# Patient Record
Sex: Male | Born: 1950 | Race: White | Hispanic: No | Marital: Married | Smoking: Former smoker
Health system: Southern US, Community
[De-identification: ages and names within clinical notes are randomized; demographics above are authoritative.]

## PROBLEM LIST (undated history)

## (undated) DIAGNOSIS — N400 Enlarged prostate without lower urinary tract symptoms: Secondary | ICD-10-CM

## (undated) DIAGNOSIS — E7800 Pure hypercholesterolemia, unspecified: Secondary | ICD-10-CM

## (undated) DIAGNOSIS — I10 Essential (primary) hypertension: Secondary | ICD-10-CM

## (undated) DIAGNOSIS — Z8679 Personal history of other diseases of the circulatory system: Secondary | ICD-10-CM

## (undated) HISTORY — PX: CARDIOVERSION: EP1203

---

## 1998-03-23 ENCOUNTER — Ambulatory Visit (HOSPITAL_COMMUNITY): Admission: RE | Admit: 1998-03-23 | Discharge: 1998-03-23 | Payer: Self-pay | Admitting: *Deleted

## 2001-12-31 ENCOUNTER — Encounter: Payer: Self-pay | Admitting: Family Medicine

## 2001-12-31 ENCOUNTER — Encounter: Admission: RE | Admit: 2001-12-31 | Discharge: 2001-12-31 | Payer: Self-pay | Admitting: Family Medicine

## 2003-06-08 ENCOUNTER — Ambulatory Visit (HOSPITAL_COMMUNITY): Admission: RE | Admit: 2003-06-08 | Discharge: 2003-06-08 | Payer: Self-pay | Admitting: Gastroenterology

## 2004-08-08 ENCOUNTER — Ambulatory Visit (HOSPITAL_COMMUNITY): Admission: RE | Admit: 2004-08-08 | Discharge: 2004-08-08 | Payer: Self-pay | Admitting: Family Medicine

## 2006-02-25 ENCOUNTER — Emergency Department (HOSPITAL_COMMUNITY): Admission: AC | Admit: 2006-02-25 | Discharge: 2006-02-25 | Payer: Self-pay | Admitting: Emergency Medicine

## 2006-04-14 ENCOUNTER — Emergency Department (HOSPITAL_COMMUNITY): Admission: EM | Admit: 2006-04-14 | Discharge: 2006-04-14 | Payer: Self-pay | Admitting: Family Medicine

## 2006-05-17 ENCOUNTER — Emergency Department (HOSPITAL_COMMUNITY): Admission: EM | Admit: 2006-05-17 | Discharge: 2006-05-17 | Payer: Self-pay | Admitting: Family Medicine

## 2006-06-05 ENCOUNTER — Emergency Department (HOSPITAL_COMMUNITY): Admission: EM | Admit: 2006-06-05 | Discharge: 2006-06-05 | Payer: Self-pay | Admitting: Family Medicine

## 2006-06-20 ENCOUNTER — Emergency Department (HOSPITAL_COMMUNITY): Admission: EM | Admit: 2006-06-20 | Discharge: 2006-06-20 | Payer: Self-pay | Admitting: Family Medicine

## 2007-01-20 ENCOUNTER — Emergency Department (HOSPITAL_COMMUNITY): Admission: EM | Admit: 2007-01-20 | Discharge: 2007-01-20 | Payer: Self-pay | Admitting: Family Medicine

## 2007-11-03 ENCOUNTER — Ambulatory Visit (HOSPITAL_COMMUNITY): Admission: RE | Admit: 2007-11-03 | Discharge: 2007-11-03 | Payer: Self-pay | Admitting: Family Medicine

## 2008-05-16 ENCOUNTER — Emergency Department (HOSPITAL_COMMUNITY): Admission: EM | Admit: 2008-05-16 | Discharge: 2008-05-16 | Payer: Self-pay | Admitting: Family Medicine

## 2008-12-30 ENCOUNTER — Ambulatory Visit: Payer: Self-pay | Admitting: Family Medicine

## 2008-12-30 DIAGNOSIS — J069 Acute upper respiratory infection, unspecified: Secondary | ICD-10-CM | POA: Insufficient documentation

## 2009-12-03 ENCOUNTER — Emergency Department (HOSPITAL_COMMUNITY): Admission: EM | Admit: 2009-12-03 | Discharge: 2009-12-03 | Payer: Self-pay | Admitting: Emergency Medicine

## 2010-03-23 ENCOUNTER — Encounter (INDEPENDENT_AMBULATORY_CARE_PROVIDER_SITE_OTHER): Payer: Self-pay | Admitting: *Deleted

## 2010-03-29 ENCOUNTER — Emergency Department (HOSPITAL_COMMUNITY): Admission: EM | Admit: 2010-03-29 | Discharge: 2010-03-29 | Payer: Self-pay | Admitting: Family Medicine

## 2010-10-30 NOTE — Letter (Signed)
Summary: Colonoscopy Date Change Letter  [facility]  [address] [place], [place] [address]   Phone: [phone]  Fax: [phone]      March 23, 2010 MRN: [id number]   Travis Barton [address] [place], [place]  [address]   Dear [name],   Previously you were recommended to have a repeat colonoscopy around this time. Your chart was recently reviewed by Dr. Judie Petit T. Russella Dar of [facility]. Follow up colonoscopy is now recommended in September 2014. This revised recommendation is based on current, nationally recognized guidelines for colorectal cancer screening and polyp surveillance. These guidelines are endorsed by the American Cancer Society, [employer] on Colorectal Cancer as well as numerous other major medical organizations.  Please understand that our recommendation assumes that you do not have any new symptoms such as bleeding, a change in bowel habits, anemia, or significant abdominal discomfort. If you do have any concerning GI symptoms or want to discuss the guideline recommendations, please call to arrange an office visit at your earliest convenience. Otherwise we will keep you in our reminder system and contact you 1-2 months prior to the date listed above to schedule your next colonoscopy.  Thank you,  Judie Petit T. Russella Dar, M.D.  [facility] [phone]

## 2010-12-16 LAB — POCT URINALYSIS DIP (DEVICE)
Protein, ur: NEGATIVE mg/dL
Specific Gravity, Urine: >=1.03 (ref 1.005–1.030)
Urobilinogen, UA: 0.2 mg/dL (ref 0.0–1.0)
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
## 2013-04-28 ENCOUNTER — Encounter: Payer: Self-pay | Admitting: Gastroenterology

## 2013-05-05 ENCOUNTER — Encounter: Payer: Self-pay | Admitting: Gastroenterology

## 2013-07-01 ENCOUNTER — Encounter: Payer: Self-pay | Admitting: Gastroenterology

## 2014-11-22 ENCOUNTER — Encounter: Payer: Self-pay | Admitting: Gastroenterology

## 2015-01-23 ENCOUNTER — Ambulatory Visit (INDEPENDENT_AMBULATORY_CARE_PROVIDER_SITE_OTHER): Payer: BC Managed Care – PPO | Admitting: Emergency Medicine

## 2015-01-23 ENCOUNTER — Ambulatory Visit (INDEPENDENT_AMBULATORY_CARE_PROVIDER_SITE_OTHER): Payer: BC Managed Care – PPO

## 2015-01-23 VITALS — BP 122/68 | HR 67 | Temp 97.8°F | Resp 16 | Ht 70.0 in | Wt 198.6 lb

## 2015-01-23 DIAGNOSIS — R0789 Other chest pain: Secondary | ICD-10-CM

## 2015-01-23 DIAGNOSIS — S20211A Contusion of right front wall of thorax, initial encounter: Secondary | ICD-10-CM | POA: Diagnosis not present

## 2015-01-23 MED ORDER — HYDROCODONE-ACETAMINOPHEN 5-325 MG PO TABS: 1.0000 | ORAL_TABLET | ORAL | Status: AC | PRN
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
## 2015-01-23 NOTE — Patient Instructions (Signed)

## 2015-10-02 MED FILL — BENICAR HCT 40-12.5 MG TAB: 40-12.5 | 86 days supply | Qty: 86 | Fill #1

## 2015-11-20 MED FILL — ATORVASTATIN 80 MG TABLET: 80 | 90 days supply | Qty: 90 | Fill #0

## 2016-01-23 MED FILL — BENICAR HCT 40-12.5 MG TAB: 40-12.5 | 90 days supply | Qty: 90 | Fill #0

## 2016-02-21 MED FILL — ATORVASTATIN 80 MG TABLET: 80 | 90 days supply | Qty: 90 | Fill #1

## 2016-05-29 MED FILL — ATORVASTATIN 80 MG TABLET: 80 | 90 days supply | Qty: 90 | Fill #0

## 2016-08-05 MED FILL — BENICAR HCT 40-12.5 MG TAB: 40-12.5 | 90 days supply | Qty: 90 | Fill #1

## 2016-09-12 MED FILL — ATORVASTATIN 80 MG TABLET: 80 | 90 days supply | Qty: 90 | Fill #1

## 2016-12-12 MED FILL — ATORVASTATIN 80 MG TABLET: 80 | 90 days supply | Qty: 90 | Fill #2

## 2017-02-17 MED FILL — OLMESARTAN-HCTZ 40-12.5 MG: 40-12.5 | 30 days supply | Qty: 30 | Fill #0

## 2017-03-21 MED FILL — ATORVASTATIN 80 MG TABLET: 80 | 90 days supply | Qty: 90 | Fill #3

## 2017-04-14 MED FILL — FLUOROURACIL 5% CREAM: 5 | 14 days supply | Qty: 40 | Fill #0

## 2017-04-21 MED FILL — OLMESARTAN-HCTZ 40-12.5 MG: 40-12.5 | 90 days supply | Qty: 90 | Fill #1

## 2017-06-17 MED FILL — ATORVASTATIN 80 MG TABLET: 80 | 90 days supply | Qty: 90 | Fill #0

## 2017-06-18 ENCOUNTER — Other Ambulatory Visit: Payer: Self-pay | Admitting: Family Medicine

## 2017-06-18 DIAGNOSIS — Z136 Encounter for screening for cardiovascular disorders: Secondary | ICD-10-CM

## 2017-07-01 ENCOUNTER — Ambulatory Visit
Admission: RE | Admit: 2017-07-01 | Discharge: 2017-07-01 | Disposition: A | Discharge status: Home / Self Care | Payer: Medicare Other | Source: Ambulatory Visit | Attending: Family Medicine | Admitting: Family Medicine

## 2017-07-01 DIAGNOSIS — Z136 Encounter for screening for cardiovascular disorders: Secondary | ICD-10-CM

## 2017-09-29 MED FILL — ATORVASTATIN 80 MG TABLET: 80 | 90 days supply | Qty: 90 | Fill #0

## 2017-10-21 MED FILL — OLMESARTAN-HCTZ 40-12.5 MG: 40-12.5 | 90 days supply | Qty: 90 | Fill #0

## 2018-01-12 MED FILL — ATORVASTATIN 80 MG TABLET: 80 | 90 days supply | Qty: 90 | Fill #1

## 2018-04-29 MED FILL — ATORVASTATIN 80 MG TABLET: 80 | 90 days supply | Qty: 90 | Fill #2

## 2018-05-05 MED FILL — MUPIROCIN 2% OINTMENT: 2 | 30 days supply | Qty: 22 | Fill #0

## 2018-05-21 MED FILL — OLMESARTAN-HCTZ 40-12.5 MG: 40-12.5 | 90 days supply | Qty: 90 | Fill #1

## 2018-05-27 IMAGING — US US AORTA SCREENING (MEDICARE)
1 series · 7 of 7 positions shown · non-contrast
Comparison: None.

CLINICAL DATA: First time exam.  Smoking history.  Appropriate age.

EXAM:
US ABDOMINAL AORTA MEDICARE SCREENING
TECHNIQUE: Ultrasound examination of the abdominal aorta was performed as a
screening evaluation for abdominal aortic aneurysm.

[Series 1: us aorta screening (medicare) · 0.30mm/px · 7 of 7 slices shown]
[im 1/7]
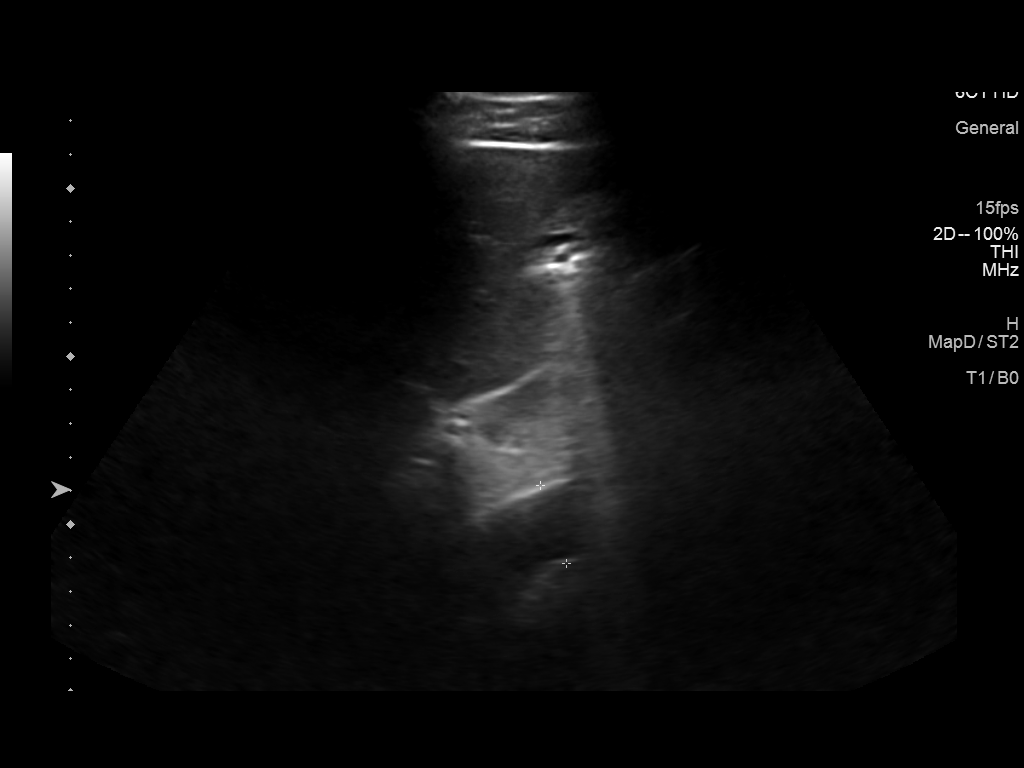
[im 2/7]
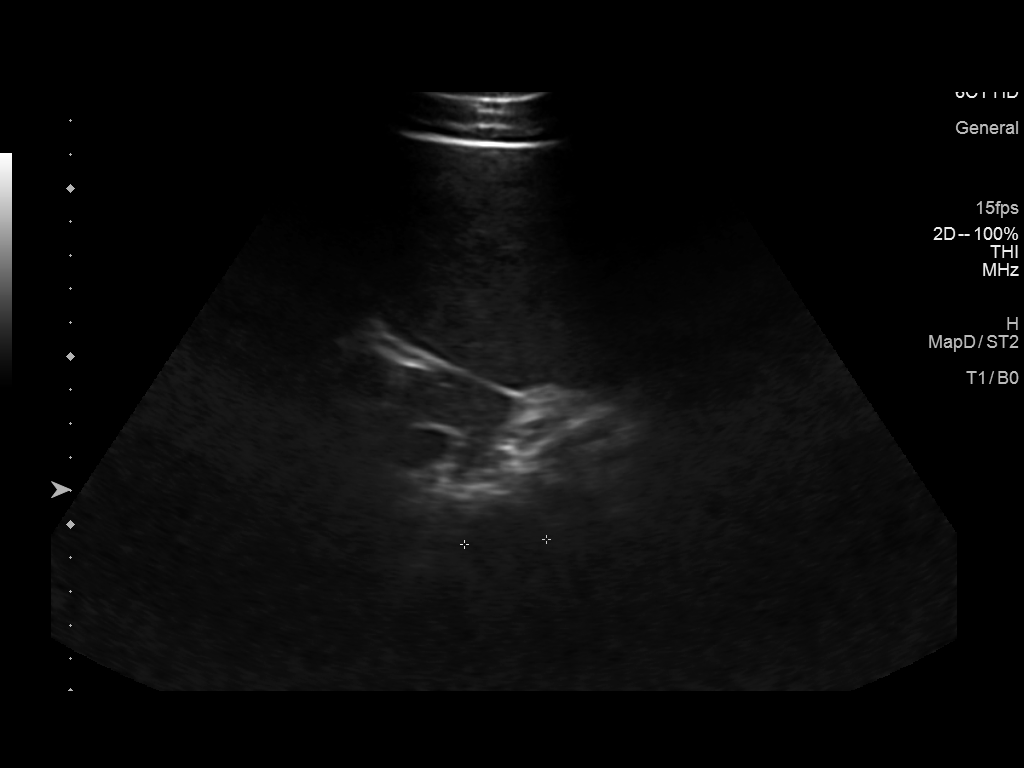
[im 3/7]
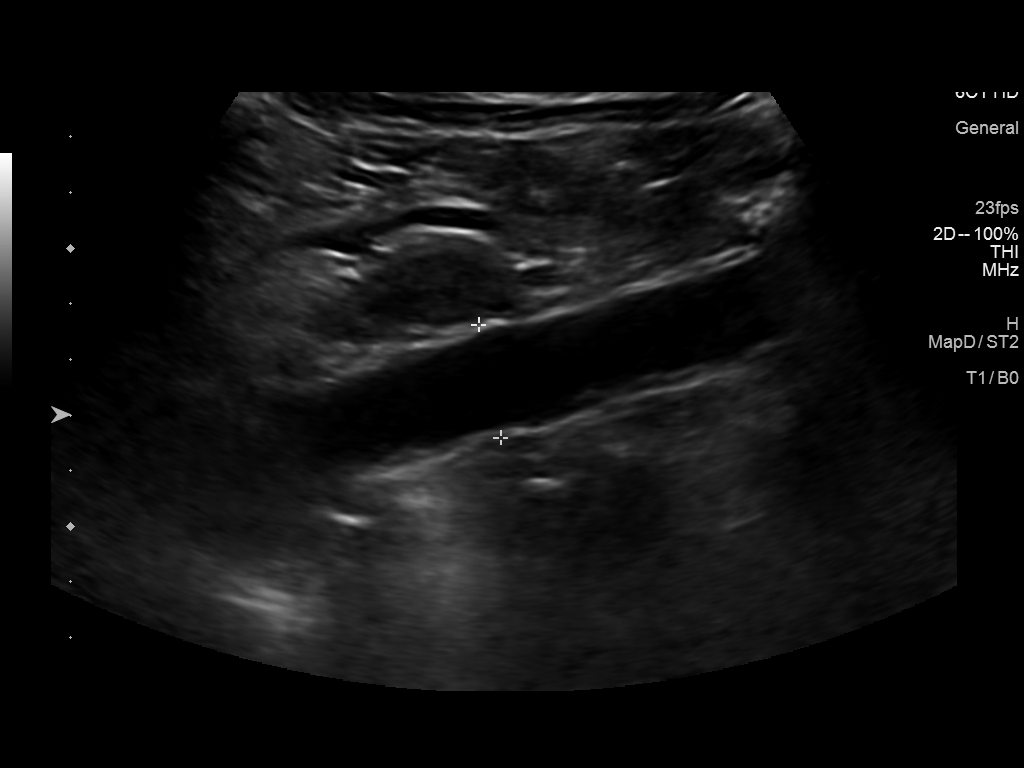
[im 4/7]
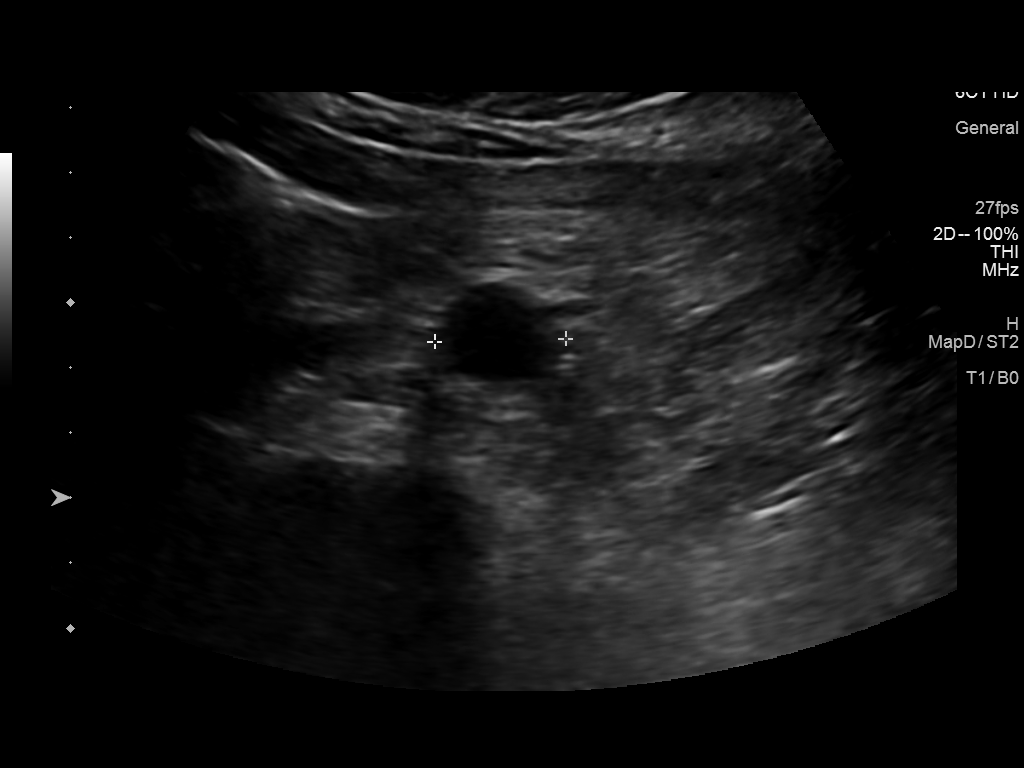
[im 5/7]
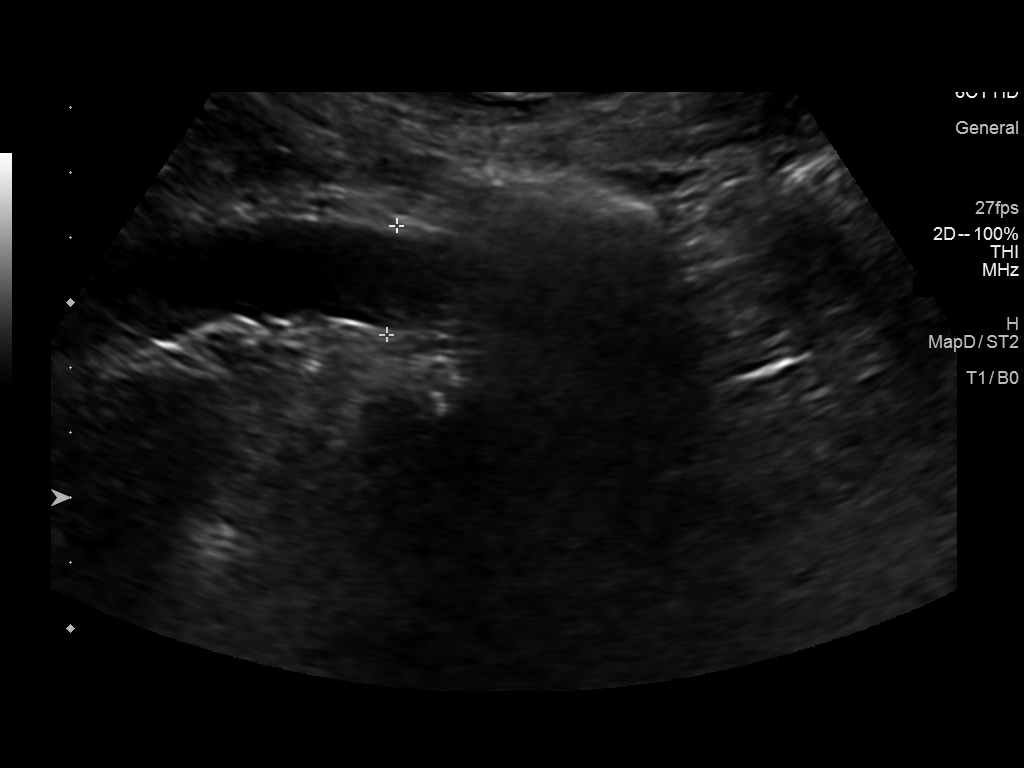
[im 6/7]
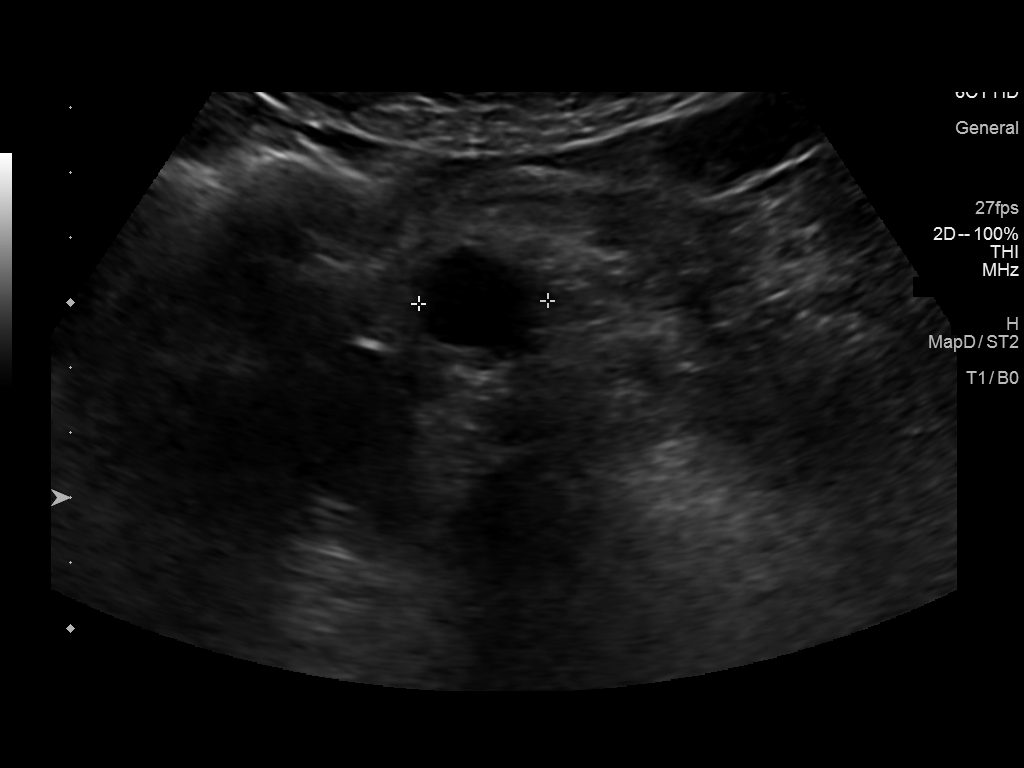
[im 7/7]
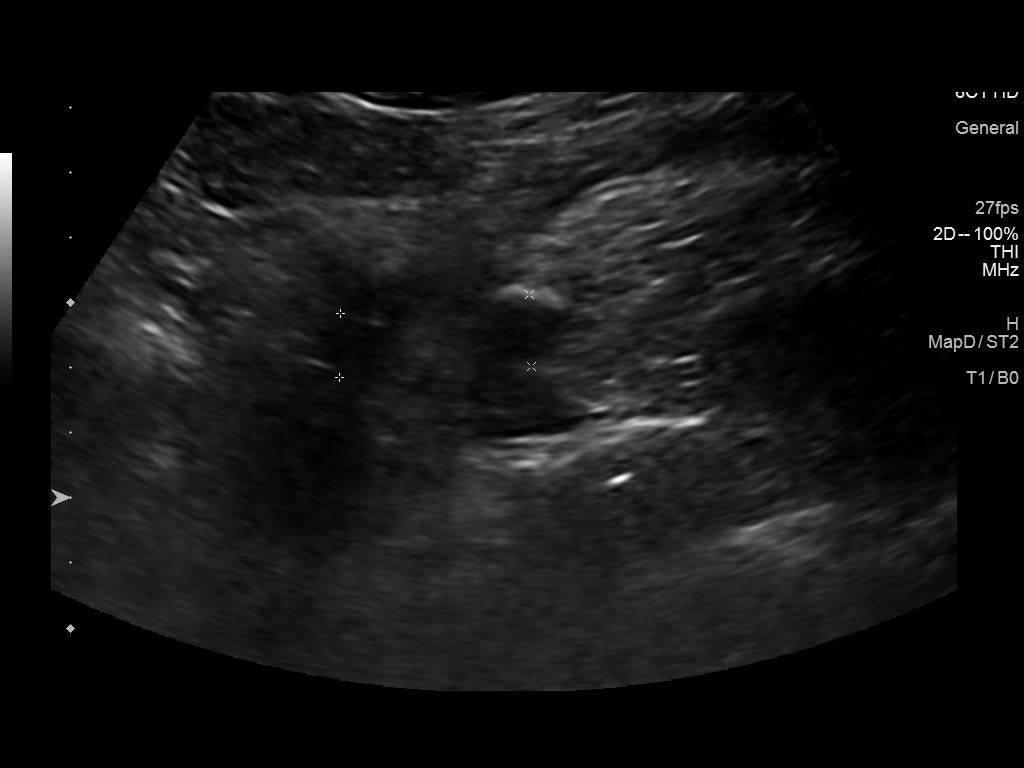

[7 of 7 positions shown; findings below may reference images not displayed]

FINDINGS: Abdominal aortic measurements as follows:

Proximal:  2.4 cm

Mid:  2.0 cm

Distal:  2.0 cm
IMPRESSION: No evidence of aortic aneurysm.

## 2018-08-12 MED FILL — ATORVASTATIN 80 MG TABLET: 80 | 90 days supply | Qty: 90 | Fill #0

## 2018-11-23 MED FILL — ATORVASTATIN CALCIUM 80 MG: 80 | 90 days supply | Qty: 90 | Fill #1

## 2018-12-11 MED FILL — OLMESARTAN-HCTZ 40-12.5 MG: 40-12.5 | 90 days supply | Qty: 90 | Fill #0

## 2019-02-26 MED FILL — ATORVASTATIN 80 MG TABLET: 80 | 90 days supply | Qty: 90 | Fill #0

## 2019-03-03 MED FILL — OLMESARTAN-HCTZ 40-12.5 MG: 40-12.5 | 30 days supply | Qty: 30 | Fill #0

## 2019-06-15 MED FILL — OLMESARTAN-HCTZ 40-12.5 MG: 40-12.5 | 30 days supply | Qty: 30 | Fill #0

## 2019-06-15 MED FILL — ATORVASTATIN 80 MG TABLET: 80 | 90 days supply | Qty: 90 | Fill #0

## 2019-07-29 MED FILL — OLMESARTAN-HCTZ 40-12.5 MG: 40-12.5 | 30 days supply | Qty: 30 | Fill #1

## 2019-09-21 MED FILL — ATORVASTATIN 80 MG TABLET: 80 | 90 days supply | Qty: 90 | Fill #0

## 2019-11-02 MED FILL — OLMESARTAN-HCTZ 40-12.5 MG: 40-12.5 | 30 days supply | Qty: 30 | Fill #2

## 2020-01-01 ENCOUNTER — Other Ambulatory Visit (HOSPITAL_COMMUNITY): Payer: Self-pay | Admitting: Family Medicine

## 2020-01-03 MED FILL — OLMESARTAN-HCTZ 40-12.5 MG: 40-12.5 | 90 days supply | Qty: 45 | Fill #0

## 2020-01-03 MED FILL — ATORVASTATIN 80 MG TABLET: 80 | 90 days supply | Qty: 90 | Fill #0

## 2020-04-10 MED FILL — ATORVASTATIN 80 MG TABLET: 80 | 90 days supply | Qty: 90 | Fill #1

## 2020-04-17 MED FILL — OLMESARTAN-HCTZ 40-12.5 MG: 40-12.5 | 90 days supply | Qty: 45 | Fill #1

## 2020-04-25 DIAGNOSIS — L814 Other melanin hyperpigmentation: Secondary | ICD-10-CM | POA: Diagnosis not present

## 2020-04-25 DIAGNOSIS — D044 Carcinoma in situ of skin of scalp and neck: Secondary | ICD-10-CM | POA: Diagnosis not present

## 2020-04-25 DIAGNOSIS — D1801 Hemangioma of skin and subcutaneous tissue: Secondary | ICD-10-CM | POA: Diagnosis not present

## 2020-04-25 DIAGNOSIS — D225 Melanocytic nevi of trunk: Secondary | ICD-10-CM | POA: Diagnosis not present

## 2020-04-25 DIAGNOSIS — D2261 Melanocytic nevi of right upper limb, including shoulder: Secondary | ICD-10-CM | POA: Diagnosis not present

## 2020-04-25 DIAGNOSIS — L570 Actinic keratosis: Secondary | ICD-10-CM | POA: Diagnosis not present

## 2020-04-25 DIAGNOSIS — D2262 Melanocytic nevi of left upper limb, including shoulder: Secondary | ICD-10-CM | POA: Diagnosis not present

## 2020-04-25 DIAGNOSIS — L821 Other seborrheic keratosis: Secondary | ICD-10-CM | POA: Diagnosis not present

## 2020-07-05 DIAGNOSIS — Z23 Encounter for immunization: Secondary | ICD-10-CM | POA: Diagnosis not present

## 2020-07-05 DIAGNOSIS — N529 Male erectile dysfunction, unspecified: Secondary | ICD-10-CM | POA: Diagnosis not present

## 2020-07-05 DIAGNOSIS — N400 Enlarged prostate without lower urinary tract symptoms: Secondary | ICD-10-CM | POA: Diagnosis not present

## 2020-07-05 DIAGNOSIS — I10 Essential (primary) hypertension: Secondary | ICD-10-CM | POA: Diagnosis not present

## 2020-07-05 DIAGNOSIS — R7309 Other abnormal glucose: Secondary | ICD-10-CM | POA: Diagnosis not present

## 2020-07-05 DIAGNOSIS — E7800 Pure hypercholesterolemia, unspecified: Secondary | ICD-10-CM | POA: Diagnosis not present

## 2020-07-05 DIAGNOSIS — M720 Palmar fascial fibromatosis [Dupuytren]: Secondary | ICD-10-CM | POA: Diagnosis not present

## 2020-07-19 DIAGNOSIS — Q7131 Congenital absence of right hand and finger: Secondary | ICD-10-CM | POA: Diagnosis not present

## 2020-07-19 DIAGNOSIS — M720 Palmar fascial fibromatosis [Dupuytren]: Secondary | ICD-10-CM | POA: Diagnosis not present

## 2020-07-24 MED FILL — OLMESARTAN-HCTZ 40-12.5 MG: 40-12.5 | 90 days supply | Qty: 45 | Fill #2

## 2020-07-24 MED FILL — ATORVASTATIN 80 MG TABLET: 80 | 90 days supply | Qty: 90 | Fill #2

## 2020-09-12 ENCOUNTER — Ambulatory Visit: Payer: Medicare PPO | Attending: Internal Medicine

## 2020-09-12 DIAGNOSIS — Z23 Encounter for immunization: Secondary | ICD-10-CM

## 2020-09-12 NOTE — Progress Notes (Signed)
   Covid-19 Vaccination Clinic  Name:  [name]    MRN: [id number] DOB: [birth year]  09/12/2020  [name] was observed post Covid-19 immunization for 15 minutes without incident. He was provided with Vaccine Information Sheet and instruction to access the V-Safe system.   [name] was instructed to call 911 with any severe reactions post vaccine: Marland Kitchen Difficulty breathing  . Swelling of face and throat  . A fast heartbeat  . A bad rash all over body  . Dizziness and weakness   Immunizations Administered    Name Date Dose VIS Date Route   Pfizer COVID-19 Vaccine 09/12/2020  2:24 PM 0.3 mL 07/19/2020 Intramuscular   Manufacturer: [employer], [employer]   Lot: [id number]   NDC: [id number]

## 2020-09-18 DIAGNOSIS — Z20822 Contact with and (suspected) exposure to covid-19: Secondary | ICD-10-CM | POA: Diagnosis not present

## 2020-10-23 DIAGNOSIS — M720 Palmar fascial fibromatosis [Dupuytren]: Secondary | ICD-10-CM | POA: Diagnosis not present

## 2020-10-24 MED FILL — OLMESARTAN-HCTZ 40-12.5 MG: 40-12.5 | 90 days supply | Qty: 45 | Fill #3

## 2020-10-24 MED FILL — ATORVASTATIN 80 MG TABLET: 80 | 90 days supply | Qty: 90 | Fill #3

## 2020-10-25 DIAGNOSIS — M720 Palmar fascial fibromatosis [Dupuytren]: Secondary | ICD-10-CM | POA: Diagnosis not present

## 2021-01-09 ENCOUNTER — Other Ambulatory Visit (HOSPITAL_COMMUNITY): Payer: Self-pay

## 2021-01-09 DIAGNOSIS — R7303 Prediabetes: Secondary | ICD-10-CM | POA: Diagnosis not present

## 2021-01-09 DIAGNOSIS — M720 Palmar fascial fibromatosis [Dupuytren]: Secondary | ICD-10-CM | POA: Diagnosis not present

## 2021-01-09 DIAGNOSIS — I10 Essential (primary) hypertension: Secondary | ICD-10-CM | POA: Diagnosis not present

## 2021-01-09 DIAGNOSIS — Z125 Encounter for screening for malignant neoplasm of prostate: Secondary | ICD-10-CM | POA: Diagnosis not present

## 2021-01-09 DIAGNOSIS — E7800 Pure hypercholesterolemia, unspecified: Secondary | ICD-10-CM | POA: Diagnosis not present

## 2021-01-09 DIAGNOSIS — N529 Male erectile dysfunction, unspecified: Secondary | ICD-10-CM | POA: Diagnosis not present

## 2021-01-09 DIAGNOSIS — Z1389 Encounter for screening for other disorder: Secondary | ICD-10-CM | POA: Diagnosis not present

## 2021-01-09 DIAGNOSIS — Z0000 Encounter for general adult medical examination without abnormal findings: Secondary | ICD-10-CM | POA: Diagnosis not present

## 2021-01-09 DIAGNOSIS — N400 Enlarged prostate without lower urinary tract symptoms: Secondary | ICD-10-CM | POA: Diagnosis not present

## 2021-01-09 MED ORDER — ATORVASTATIN CALCIUM 80 MG PO TABS
ORAL_TABLET | ORAL | 1 refills | Status: DC
  Filled 2021-01-09 – 2021-01-17 (×2): qty 90, 90d supply, fill #0
  Filled 2021-04-24: qty 90, 90d supply, fill #1

## 2021-01-09 MED ORDER — OLMESARTAN MEDOXOMIL-HCTZ 40-12.5 MG PO TABS
ORAL_TABLET | ORAL | 1 refills | Status: AC
  Filled 2021-01-09 – 2021-01-17 (×2): qty 90, 90d supply, fill #0
  Filled 2021-07-27: qty 90, 90d supply, fill #1

## 2021-01-10 ENCOUNTER — Other Ambulatory Visit (HOSPITAL_COMMUNITY): Payer: Self-pay

## 2021-01-16 ENCOUNTER — Other Ambulatory Visit (HOSPITAL_COMMUNITY): Payer: Self-pay

## 2021-01-17 ENCOUNTER — Other Ambulatory Visit (HOSPITAL_COMMUNITY): Payer: Self-pay

## 2021-01-19 ENCOUNTER — Other Ambulatory Visit (HOSPITAL_COMMUNITY): Payer: Self-pay

## 2021-04-24 ENCOUNTER — Other Ambulatory Visit (HOSPITAL_COMMUNITY): Payer: Self-pay

## 2021-04-26 ENCOUNTER — Other Ambulatory Visit (HOSPITAL_COMMUNITY): Payer: Self-pay

## 2021-04-26 DIAGNOSIS — D225 Melanocytic nevi of trunk: Secondary | ICD-10-CM | POA: Diagnosis not present

## 2021-04-26 DIAGNOSIS — L570 Actinic keratosis: Secondary | ICD-10-CM | POA: Diagnosis not present

## 2021-04-26 DIAGNOSIS — L814 Other melanin hyperpigmentation: Secondary | ICD-10-CM | POA: Diagnosis not present

## 2021-04-26 DIAGNOSIS — D1801 Hemangioma of skin and subcutaneous tissue: Secondary | ICD-10-CM | POA: Diagnosis not present

## 2021-04-26 DIAGNOSIS — D2261 Melanocytic nevi of right upper limb, including shoulder: Secondary | ICD-10-CM | POA: Diagnosis not present

## 2021-04-26 DIAGNOSIS — L821 Other seborrheic keratosis: Secondary | ICD-10-CM | POA: Diagnosis not present

## 2021-04-26 DIAGNOSIS — Z85828 Personal history of other malignant neoplasm of skin: Secondary | ICD-10-CM | POA: Diagnosis not present

## 2021-04-26 DIAGNOSIS — C44622 Squamous cell carcinoma of skin of right upper limb, including shoulder: Secondary | ICD-10-CM | POA: Diagnosis not present

## 2021-04-26 DIAGNOSIS — D2262 Melanocytic nevi of left upper limb, including shoulder: Secondary | ICD-10-CM | POA: Diagnosis not present

## 2021-04-26 MED ORDER — FLUOROURACIL 5 % EX CREA
TOPICAL_CREAM | CUTANEOUS | 0 refills | Status: AC
  Filled 2021-04-26: qty 40, 20d supply, fill #0

## 2021-06-01 ENCOUNTER — Other Ambulatory Visit (HOSPITAL_COMMUNITY): Payer: Self-pay

## 2021-06-05 ENCOUNTER — Other Ambulatory Visit (HOSPITAL_COMMUNITY): Payer: Self-pay

## 2021-07-09 ENCOUNTER — Other Ambulatory Visit (HOSPITAL_COMMUNITY): Payer: Self-pay

## 2021-07-09 DIAGNOSIS — N400 Enlarged prostate without lower urinary tract symptoms: Secondary | ICD-10-CM | POA: Diagnosis not present

## 2021-07-09 DIAGNOSIS — I10 Essential (primary) hypertension: Secondary | ICD-10-CM | POA: Diagnosis not present

## 2021-07-09 DIAGNOSIS — M720 Palmar fascial fibromatosis [Dupuytren]: Secondary | ICD-10-CM | POA: Diagnosis not present

## 2021-07-09 DIAGNOSIS — R7303 Prediabetes: Secondary | ICD-10-CM | POA: Diagnosis not present

## 2021-07-09 DIAGNOSIS — Z23 Encounter for immunization: Secondary | ICD-10-CM | POA: Diagnosis not present

## 2021-07-09 DIAGNOSIS — N529 Male erectile dysfunction, unspecified: Secondary | ICD-10-CM | POA: Diagnosis not present

## 2021-07-09 DIAGNOSIS — E7800 Pure hypercholesterolemia, unspecified: Secondary | ICD-10-CM | POA: Diagnosis not present

## 2021-07-09 MED ORDER — OLMESARTAN MEDOXOMIL-HCTZ 40-12.5 MG PO TABS
ORAL_TABLET | ORAL | 1 refills | Status: AC
  Filled 2021-07-09: qty 90, 90d supply, fill #0

## 2021-07-09 MED ORDER — ATORVASTATIN CALCIUM 80 MG PO TABS
80.0000 mg | ORAL_TABLET | Freq: Every day | ORAL | 1 refills | Status: DC
  Filled 2021-07-09 – 2021-07-27 (×2): qty 90, 90d supply, fill #0
  Filled 2021-10-29: qty 90, 90d supply, fill #1

## 2021-07-27 ENCOUNTER — Other Ambulatory Visit (HOSPITAL_COMMUNITY): Payer: Self-pay

## 2021-07-28 ENCOUNTER — Other Ambulatory Visit (HOSPITAL_COMMUNITY): Payer: Self-pay

## 2021-07-30 ENCOUNTER — Other Ambulatory Visit (HOSPITAL_COMMUNITY): Payer: Self-pay

## 2021-08-06 DIAGNOSIS — N289 Disorder of kidney and ureter, unspecified: Secondary | ICD-10-CM | POA: Diagnosis not present

## 2021-10-29 ENCOUNTER — Other Ambulatory Visit (HOSPITAL_COMMUNITY): Payer: Self-pay

## 2021-10-30 ENCOUNTER — Other Ambulatory Visit (HOSPITAL_COMMUNITY): Payer: Self-pay

## 2022-01-16 ENCOUNTER — Other Ambulatory Visit (HOSPITAL_COMMUNITY): Payer: Self-pay

## 2022-01-16 DIAGNOSIS — R7303 Prediabetes: Secondary | ICD-10-CM | POA: Diagnosis not present

## 2022-01-16 DIAGNOSIS — Z0000 Encounter for general adult medical examination without abnormal findings: Secondary | ICD-10-CM | POA: Diagnosis not present

## 2022-01-16 DIAGNOSIS — Z1389 Encounter for screening for other disorder: Secondary | ICD-10-CM | POA: Diagnosis not present

## 2022-01-16 DIAGNOSIS — Z125 Encounter for screening for malignant neoplasm of prostate: Secondary | ICD-10-CM | POA: Diagnosis not present

## 2022-01-16 DIAGNOSIS — N400 Enlarged prostate without lower urinary tract symptoms: Secondary | ICD-10-CM | POA: Diagnosis not present

## 2022-01-16 DIAGNOSIS — N529 Male erectile dysfunction, unspecified: Secondary | ICD-10-CM | POA: Diagnosis not present

## 2022-01-16 DIAGNOSIS — M720 Palmar fascial fibromatosis [Dupuytren]: Secondary | ICD-10-CM | POA: Diagnosis not present

## 2022-01-16 DIAGNOSIS — I10 Essential (primary) hypertension: Secondary | ICD-10-CM | POA: Diagnosis not present

## 2022-01-16 DIAGNOSIS — E7800 Pure hypercholesterolemia, unspecified: Secondary | ICD-10-CM | POA: Diagnosis not present

## 2022-01-16 MED ORDER — OLMESARTAN MEDOXOMIL-HCTZ 40-12.5 MG PO TABS
ORAL_TABLET | ORAL | 3 refills | Status: AC
  Filled 2022-01-16: qty 45, 90d supply, fill #0

## 2022-01-16 MED ORDER — ATORVASTATIN CALCIUM 80 MG PO TABS
ORAL_TABLET | ORAL | 3 refills | Status: AC
  Filled 2022-01-16: qty 90, 90d supply, fill #0
  Filled 2022-05-01: qty 90, 90d supply, fill #1
  Filled 2022-07-31: qty 90, 90d supply, fill #2
  Filled 2022-11-04: qty 90, 90d supply, fill #3

## 2022-01-18 ENCOUNTER — Other Ambulatory Visit (HOSPITAL_COMMUNITY): Payer: Self-pay

## 2022-01-24 ENCOUNTER — Other Ambulatory Visit (HOSPITAL_COMMUNITY): Payer: Self-pay

## 2022-02-07 DIAGNOSIS — N1831 Chronic kidney disease, stage 3a: Secondary | ICD-10-CM | POA: Diagnosis not present

## 2022-02-27 DIAGNOSIS — I10 Essential (primary) hypertension: Secondary | ICD-10-CM | POA: Diagnosis not present

## 2022-02-27 DIAGNOSIS — N400 Enlarged prostate without lower urinary tract symptoms: Secondary | ICD-10-CM | POA: Diagnosis not present

## 2022-03-29 ENCOUNTER — Other Ambulatory Visit (HOSPITAL_COMMUNITY): Payer: Self-pay

## 2022-03-29 MED ORDER — TAMSULOSIN HCL 0.4 MG PO CAPS: ORAL_CAPSULE | ORAL | 1 refills | Status: AC

## 2022-04-19 ENCOUNTER — Other Ambulatory Visit (HOSPITAL_COMMUNITY): Payer: Self-pay

## 2022-05-01 ENCOUNTER — Other Ambulatory Visit (HOSPITAL_COMMUNITY): Payer: Self-pay

## 2022-05-01 DIAGNOSIS — D0359 Melanoma in situ of other part of trunk: Secondary | ICD-10-CM | POA: Diagnosis not present

## 2022-05-01 DIAGNOSIS — L821 Other seborrheic keratosis: Secondary | ICD-10-CM | POA: Diagnosis not present

## 2022-05-01 DIAGNOSIS — L570 Actinic keratosis: Secondary | ICD-10-CM | POA: Diagnosis not present

## 2022-05-01 DIAGNOSIS — Z85828 Personal history of other malignant neoplasm of skin: Secondary | ICD-10-CM | POA: Diagnosis not present

## 2022-05-01 DIAGNOSIS — L814 Other melanin hyperpigmentation: Secondary | ICD-10-CM | POA: Diagnosis not present

## 2022-05-01 MED ORDER — FLUOROURACIL 5 % EX CREA
TOPICAL_CREAM | CUTANEOUS | 0 refills | Status: AC
  Filled 2022-05-01: qty 40, 14d supply, fill #0

## 2022-05-08 ENCOUNTER — Other Ambulatory Visit (HOSPITAL_COMMUNITY): Payer: Self-pay

## 2022-05-08 DIAGNOSIS — L988 Other specified disorders of the skin and subcutaneous tissue: Secondary | ICD-10-CM | POA: Diagnosis not present

## 2022-05-08 DIAGNOSIS — D0359 Melanoma in situ of other part of trunk: Secondary | ICD-10-CM | POA: Diagnosis not present

## 2022-05-08 DIAGNOSIS — Z85828 Personal history of other malignant neoplasm of skin: Secondary | ICD-10-CM | POA: Diagnosis not present

## 2022-05-08 MED ORDER — MUPIROCIN 2 % EX OINT
TOPICAL_OINTMENT | CUTANEOUS | 0 refills | Status: AC
  Filled 2022-05-08: qty 22, 10d supply, fill #0

## 2022-07-09 DIAGNOSIS — Z23 Encounter for immunization: Secondary | ICD-10-CM | POA: Diagnosis not present

## 2022-07-16 DIAGNOSIS — N400 Enlarged prostate without lower urinary tract symptoms: Secondary | ICD-10-CM | POA: Diagnosis not present

## 2022-07-16 DIAGNOSIS — M720 Palmar fascial fibromatosis [Dupuytren]: Secondary | ICD-10-CM | POA: Diagnosis not present

## 2022-07-16 DIAGNOSIS — N1831 Chronic kidney disease, stage 3a: Secondary | ICD-10-CM | POA: Diagnosis not present

## 2022-07-16 DIAGNOSIS — E7800 Pure hypercholesterolemia, unspecified: Secondary | ICD-10-CM | POA: Diagnosis not present

## 2022-07-16 DIAGNOSIS — I10 Essential (primary) hypertension: Secondary | ICD-10-CM | POA: Diagnosis not present

## 2022-07-16 DIAGNOSIS — R7303 Prediabetes: Secondary | ICD-10-CM | POA: Diagnosis not present

## 2022-08-01 ENCOUNTER — Other Ambulatory Visit (HOSPITAL_COMMUNITY): Payer: Self-pay

## 2022-11-08 DIAGNOSIS — D2272 Melanocytic nevi of left lower limb, including hip: Secondary | ICD-10-CM | POA: Diagnosis not present

## 2022-11-08 DIAGNOSIS — L570 Actinic keratosis: Secondary | ICD-10-CM | POA: Diagnosis not present

## 2022-11-08 DIAGNOSIS — L578 Other skin changes due to chronic exposure to nonionizing radiation: Secondary | ICD-10-CM | POA: Diagnosis not present

## 2022-11-08 DIAGNOSIS — D2261 Melanocytic nevi of right upper limb, including shoulder: Secondary | ICD-10-CM | POA: Diagnosis not present

## 2022-11-08 DIAGNOSIS — L814 Other melanin hyperpigmentation: Secondary | ICD-10-CM | POA: Diagnosis not present

## 2022-11-08 DIAGNOSIS — Z85820 Personal history of malignant melanoma of skin: Secondary | ICD-10-CM | POA: Diagnosis not present

## 2022-11-08 DIAGNOSIS — L821 Other seborrheic keratosis: Secondary | ICD-10-CM | POA: Diagnosis not present

## 2022-11-08 DIAGNOSIS — D225 Melanocytic nevi of trunk: Secondary | ICD-10-CM | POA: Diagnosis not present

## 2022-11-08 DIAGNOSIS — Z85828 Personal history of other malignant neoplasm of skin: Secondary | ICD-10-CM | POA: Diagnosis not present

## 2023-01-16 DIAGNOSIS — N1831 Chronic kidney disease, stage 3a: Secondary | ICD-10-CM | POA: Diagnosis not present

## 2023-01-16 DIAGNOSIS — I10 Essential (primary) hypertension: Secondary | ICD-10-CM | POA: Diagnosis not present

## 2023-01-16 DIAGNOSIS — N400 Enlarged prostate without lower urinary tract symptoms: Secondary | ICD-10-CM | POA: Diagnosis not present

## 2023-01-16 DIAGNOSIS — Z125 Encounter for screening for malignant neoplasm of prostate: Secondary | ICD-10-CM | POA: Diagnosis not present

## 2023-01-16 DIAGNOSIS — Z0000 Encounter for general adult medical examination without abnormal findings: Secondary | ICD-10-CM | POA: Diagnosis not present

## 2023-01-16 DIAGNOSIS — E7800 Pure hypercholesterolemia, unspecified: Secondary | ICD-10-CM | POA: Diagnosis not present

## 2023-01-16 DIAGNOSIS — R7303 Prediabetes: Secondary | ICD-10-CM | POA: Diagnosis not present

## 2023-05-06 DIAGNOSIS — L821 Other seborrheic keratosis: Secondary | ICD-10-CM | POA: Diagnosis not present

## 2023-05-06 DIAGNOSIS — Z85820 Personal history of malignant melanoma of skin: Secondary | ICD-10-CM | POA: Diagnosis not present

## 2023-05-06 DIAGNOSIS — D2262 Melanocytic nevi of left upper limb, including shoulder: Secondary | ICD-10-CM | POA: Diagnosis not present

## 2023-05-06 DIAGNOSIS — L814 Other melanin hyperpigmentation: Secondary | ICD-10-CM | POA: Diagnosis not present

## 2023-05-06 DIAGNOSIS — D225 Melanocytic nevi of trunk: Secondary | ICD-10-CM | POA: Diagnosis not present

## 2023-05-06 DIAGNOSIS — D2261 Melanocytic nevi of right upper limb, including shoulder: Secondary | ICD-10-CM | POA: Diagnosis not present

## 2023-05-06 DIAGNOSIS — C44519 Basal cell carcinoma of skin of other part of trunk: Secondary | ICD-10-CM | POA: Diagnosis not present

## 2023-05-06 DIAGNOSIS — L570 Actinic keratosis: Secondary | ICD-10-CM | POA: Diagnosis not present

## 2023-05-06 DIAGNOSIS — C44712 Basal cell carcinoma of skin of right lower limb, including hip: Secondary | ICD-10-CM | POA: Diagnosis not present

## 2023-07-01 DIAGNOSIS — Z23 Encounter for immunization: Secondary | ICD-10-CM | POA: Diagnosis not present

## 2023-07-29 DIAGNOSIS — Z1211 Encounter for screening for malignant neoplasm of colon: Secondary | ICD-10-CM | POA: Diagnosis not present

## 2023-07-29 DIAGNOSIS — I10 Essential (primary) hypertension: Secondary | ICD-10-CM | POA: Diagnosis not present

## 2023-10-16 DIAGNOSIS — I10 Essential (primary) hypertension: Secondary | ICD-10-CM | POA: Diagnosis not present

## 2023-10-16 DIAGNOSIS — R002 Palpitations: Secondary | ICD-10-CM | POA: Diagnosis not present

## 2023-10-16 DIAGNOSIS — R7303 Prediabetes: Secondary | ICD-10-CM | POA: Diagnosis not present

## 2023-10-16 DIAGNOSIS — E7800 Pure hypercholesterolemia, unspecified: Secondary | ICD-10-CM | POA: Diagnosis not present

## 2023-10-16 DIAGNOSIS — N1831 Chronic kidney disease, stage 3a: Secondary | ICD-10-CM | POA: Diagnosis not present

## 2023-10-22 DIAGNOSIS — I483 Typical atrial flutter: Secondary | ICD-10-CM | POA: Diagnosis not present

## 2023-10-22 DIAGNOSIS — E785 Hyperlipidemia, unspecified: Secondary | ICD-10-CM | POA: Diagnosis not present

## 2023-10-22 DIAGNOSIS — R002 Palpitations: Secondary | ICD-10-CM | POA: Diagnosis not present

## 2023-10-22 DIAGNOSIS — R030 Elevated blood-pressure reading, without diagnosis of hypertension: Secondary | ICD-10-CM | POA: Diagnosis not present

## 2023-10-22 DIAGNOSIS — Z1330 Encounter for screening examination for mental health and behavioral disorders, unspecified: Secondary | ICD-10-CM | POA: Diagnosis not present

## 2023-10-22 DIAGNOSIS — R55 Syncope and collapse: Secondary | ICD-10-CM | POA: Diagnosis not present

## 2023-10-22 DIAGNOSIS — R0600 Dyspnea, unspecified: Secondary | ICD-10-CM | POA: Diagnosis not present

## 2023-10-29 DIAGNOSIS — I483 Typical atrial flutter: Secondary | ICD-10-CM | POA: Diagnosis not present

## 2023-10-29 DIAGNOSIS — R0600 Dyspnea, unspecified: Secondary | ICD-10-CM | POA: Diagnosis not present

## 2023-10-30 DIAGNOSIS — E785 Hyperlipidemia, unspecified: Secondary | ICD-10-CM | POA: Diagnosis not present

## 2023-10-30 DIAGNOSIS — I483 Typical atrial flutter: Secondary | ICD-10-CM | POA: Diagnosis not present

## 2023-10-30 DIAGNOSIS — R0600 Dyspnea, unspecified: Secondary | ICD-10-CM | POA: Diagnosis not present

## 2023-11-06 DIAGNOSIS — I483 Typical atrial flutter: Secondary | ICD-10-CM | POA: Diagnosis not present

## 2023-11-06 DIAGNOSIS — R0600 Dyspnea, unspecified: Secondary | ICD-10-CM | POA: Diagnosis not present

## 2023-11-07 DIAGNOSIS — Z85828 Personal history of other malignant neoplasm of skin: Secondary | ICD-10-CM | POA: Diagnosis not present

## 2023-11-07 DIAGNOSIS — L570 Actinic keratosis: Secondary | ICD-10-CM | POA: Diagnosis not present

## 2023-11-07 DIAGNOSIS — D2262 Melanocytic nevi of left upper limb, including shoulder: Secondary | ICD-10-CM | POA: Diagnosis not present

## 2023-11-07 DIAGNOSIS — L821 Other seborrheic keratosis: Secondary | ICD-10-CM | POA: Diagnosis not present

## 2023-11-07 DIAGNOSIS — L814 Other melanin hyperpigmentation: Secondary | ICD-10-CM | POA: Diagnosis not present

## 2023-11-07 DIAGNOSIS — D2261 Melanocytic nevi of right upper limb, including shoulder: Secondary | ICD-10-CM | POA: Diagnosis not present

## 2023-11-07 DIAGNOSIS — D2271 Melanocytic nevi of right lower limb, including hip: Secondary | ICD-10-CM | POA: Diagnosis not present

## 2023-11-07 DIAGNOSIS — D225 Melanocytic nevi of trunk: Secondary | ICD-10-CM | POA: Diagnosis not present

## 2023-11-07 DIAGNOSIS — D044 Carcinoma in situ of skin of scalp and neck: Secondary | ICD-10-CM | POA: Diagnosis not present

## 2023-11-13 DIAGNOSIS — Z7901 Long term (current) use of anticoagulants: Secondary | ICD-10-CM | POA: Diagnosis not present

## 2023-11-13 DIAGNOSIS — E785 Hyperlipidemia, unspecified: Secondary | ICD-10-CM | POA: Diagnosis not present

## 2023-11-13 DIAGNOSIS — I10 Essential (primary) hypertension: Secondary | ICD-10-CM | POA: Diagnosis not present

## 2023-11-13 DIAGNOSIS — I483 Typical atrial flutter: Secondary | ICD-10-CM | POA: Diagnosis not present

## 2023-11-17 DIAGNOSIS — I483 Typical atrial flutter: Secondary | ICD-10-CM | POA: Diagnosis not present

## 2023-11-17 DIAGNOSIS — Z7901 Long term (current) use of anticoagulants: Secondary | ICD-10-CM | POA: Diagnosis not present

## 2023-11-17 DIAGNOSIS — I4892 Unspecified atrial flutter: Secondary | ICD-10-CM | POA: Diagnosis not present

## 2023-12-11 DIAGNOSIS — I4892 Unspecified atrial flutter: Secondary | ICD-10-CM | POA: Diagnosis not present

## 2023-12-11 DIAGNOSIS — Z79899 Other long term (current) drug therapy: Secondary | ICD-10-CM | POA: Diagnosis not present

## 2023-12-11 DIAGNOSIS — E785 Hyperlipidemia, unspecified: Secondary | ICD-10-CM | POA: Diagnosis not present

## 2023-12-11 DIAGNOSIS — I4891 Unspecified atrial fibrillation: Secondary | ICD-10-CM | POA: Diagnosis not present

## 2023-12-11 DIAGNOSIS — I10 Essential (primary) hypertension: Secondary | ICD-10-CM | POA: Diagnosis not present

## 2023-12-23 DIAGNOSIS — R002 Palpitations: Secondary | ICD-10-CM | POA: Diagnosis not present

## 2024-01-07 DIAGNOSIS — R361 Hematospermia: Secondary | ICD-10-CM | POA: Diagnosis not present

## 2024-01-12 ENCOUNTER — Other Ambulatory Visit: Payer: Self-pay

## 2024-01-12 DIAGNOSIS — I10 Essential (primary) hypertension: Secondary | ICD-10-CM | POA: Insufficient documentation

## 2024-01-12 DIAGNOSIS — J029 Acute pharyngitis, unspecified: Secondary | ICD-10-CM | POA: Diagnosis present

## 2024-01-12 DIAGNOSIS — Z7901 Long term (current) use of anticoagulants: Secondary | ICD-10-CM | POA: Insufficient documentation

## 2024-01-12 DIAGNOSIS — Z79899 Other long term (current) drug therapy: Secondary | ICD-10-CM | POA: Insufficient documentation

## 2024-01-12 DIAGNOSIS — U071 COVID-19: Secondary | ICD-10-CM | POA: Diagnosis not present

## 2024-01-13 ENCOUNTER — Emergency Department (HOSPITAL_BASED_OUTPATIENT_CLINIC_OR_DEPARTMENT_OTHER)
Admission: EM | Admit: 2024-01-13 | Discharge: 2024-01-13 | Disposition: A | Discharge status: Home / Self Care | Attending: Emergency Medicine | Admitting: Emergency Medicine

## 2024-01-13 DIAGNOSIS — Z7901 Long term (current) use of anticoagulants: Secondary | ICD-10-CM

## 2024-01-13 DIAGNOSIS — U071 COVID-19: Secondary | ICD-10-CM

## 2024-01-13 LAB — BASIC METABOLIC PANEL WITH GFR
Anion gap: 9 (ref 5–15)
BUN: 17 mg/dL (ref 8–23)
CO2: 27 mmol/L (ref 22–32)
Calcium: 8.8 mg/dL — ABNORMAL LOW (ref 8.9–10.3)
Chloride: 97 mmol/L — ABNORMAL LOW (ref 98–111)
Creatinine, Ser: 1.25 mg/dL — ABNORMAL HIGH (ref 0.61–1.24)
GFR, Estimated: >60 mL/min (ref >60)
Glucose, Bld: 114 mg/dL — ABNORMAL HIGH (ref 70–99)
Potassium: 3.9 mmol/L (ref 3.5–5.1)
Sodium: 133 mmol/L — ABNORMAL LOW (ref 135–145)

## 2024-01-13 LAB — CBC
HCT: 44.5 % (ref 39.0–52.0)
Hemoglobin: 15.3 g/dL (ref 13.0–17.0)
MCH: 31.5 pg (ref 26.0–34.0)
MCHC: 34.4 g/dL (ref 30.0–36.0)
MCV: 91.8 fL (ref 80.0–100.0)
Platelets: 153 10*3/uL (ref 150–400)
RBC: 4.85 MIL/uL (ref 4.22–5.81)
RDW: 12.8 % (ref 11.5–15.5)
WBC: 7.9 10*3/uL (ref 4.0–10.5)
nRBC: 0 % (ref 0.0–0.2)

## 2024-01-13 LAB — RESP PANEL BY RT-PCR (RSV, FLU A&B, COVID)  RVPGX2
Influenza A by PCR: NEGATIVE
Influenza B by PCR: NEGATIVE
Resp Syncytial Virus by PCR: NEGATIVE
SARS Coronavirus 2 by RT PCR: POSITIVE — AB

## 2024-01-13 LAB — URINALYSIS, ROUTINE W REFLEX MICROSCOPIC
Bilirubin Urine: NEGATIVE
Glucose, UA: NEGATIVE mg/dL
Ketones, ur: NEGATIVE mg/dL
Leukocytes,Ua: NEGATIVE
Nitrite: NEGATIVE
Protein, ur: 30 mg/dL — AB
Specific Gravity, Urine: 1.021 (ref 1.005–1.030)
pH: 5.5 (ref 5.0–8.0)

## 2024-01-13 LAB — GROUP A STREP BY PCR: Group A Strep by PCR: NOT DETECTED

## 2024-01-13 MED ORDER — ACETAMINOPHEN 325 MG PO TABS
650.0000 mg | ORAL_TABLET | Freq: Once | ORAL | Status: AC | PRN
  Administered 2024-01-13: 650 mg via ORAL
  Filled 2024-01-13: qty 2

## 2024-01-13 MED ORDER — PAXLOVID (300/100) 20 X 150 MG & 10 X 100MG PO TBPK: 3.0000 | ORAL_TABLET | Freq: Two times a day (BID) | ORAL | 0 refills | Status: AC

## 2024-01-13 NOTE — ED Triage Notes (Signed)
 Patient reports sore throat and fever for 3 days. Also reports that his back has been hurting today. Fever 102.5 in triage. Patient states he took tylenol at 1430. Patient also states he urinates frequently. Patient saw his PCP for dark blood in semen.

## 2024-01-13 NOTE — Discharge Instructions (Addendum)
 Drink plenty of fluids.  Take acetaminophen as needed for fever or aching.  Return if you develop difficulty breathing or confusion.

## 2024-01-13 NOTE — ED Provider Notes (Signed)
 [place] EMERGENCY DEPARTMENT AT [facility] Provider Note   CSN: [id number] Arrival date & time: 01/12/24  2355     History  Chief Complaint  Patient presents with   Sore Throat    Travis Barton is a 73 y.o. male.  The history is provided by the patient.  Sore Throat  He has a history of hypertension, hyperlipidemia, atrial flutter anticoagulated on apixaban and comes in with 3-day history of fever to 103, chills, sweats, sore throat, nonproductive cough, body aches.  He denies any sick contacts.   Home Medications Prior to Admission medications   Medication Sig Start Date End Date Taking? Authorizing Provider  nirmatrelvir/ritonavir (PAXLOVID, 300/100,) 20 x 150 MG & 10 x 100MG  TBPK Take 3 tablets by mouth 2 (two) times daily for 5 days. Patient GFR is >60. Take nirmatrelvir (150 mg) two tablets twice daily for 5 days and ritonavir (100 mg) one tablet twice daily for 5 days. 01/13/24 01/18/24 Yes Dione Booze, MD  atorvastatin (LIPITOR) 10 MG tablet Take 80 mg by mouth daily.     [provider]  atorvastatin (LIPITOR) 80 MG tablet Take 1 tablet by mouth once a day. 01/16/22     atorvastatin (LIPITOR) 80 MG tablet TAKE 1 TABLET BY MOUTH ONCE DAILY 01/01/20 12/31/20  Elias Else, MD  fluorouracil (EFUDEX) 5 % cream Apply 1 application on the skin twice a day for 2 weeks. 04/26/21     fluorouracil (EFUDEX) 5 % cream Apply to affected area twice a day for 2 weeks as directed. 05/01/22     HYDROcodone-acetaminophen (NORCO) 5-325 MG per tablet Take 1-2 tablets by mouth every 4 (four) hours as needed. 01/23/15   Carmelina Dane, MD  mupirocin ointment Idelle Jo) 2 % Apply a thin layer to affected areas of skin twice a day as directed 05/08/22     olmesartan-hydrochlorothiazide (BENICAR HCT) 40-12.5 MG per tablet Take 1 tablet by mouth daily.    [provider]  olmesartan-hydrochlorothiazide (BENICAR HCT) 40-12.5 MG tablet Take 1 tablet by mouth every morning  07/09/21     olmesartan-hydrochlorothiazide (BENICAR HCT) 40-12.5 MG tablet Take 1/2 tablet by mouth every morning. 01/16/22     olmesartan-hydrochlorothiazide (BENICAR HCT) 40-12.5 MG tablet TAKE 1/2 TABLET EVERY MORNING BY MOUTH 01/01/20 12/31/20  Elias Else, MD  olmesartan-hydrochlorothiazide (BENICAR HCT) 40-12.5 MG tablet Take 1 tablet by mouth every morning 01/09/21     tamsulosin (FLOMAX) 0.4 MG CAPS capsule Take 2 capsules by mouth Once a day, 30 minutes after evening meals. 03/29/22         Allergies    Patient has no known allergies.    Review of Systems   Review of Systems  All other systems reviewed and are negative.   Physical Exam Updated Vital Signs BP 127/71 (BP Location: Right Arm)   Pulse 78   Temp 98.8 F (37.1 C) (Oral)   Resp 15   Ht 5\' 9"  (1.753 m)   Wt 81.6 kg   SpO2 94%   BMI 26.58 kg/m  Physical Exam Vitals and nursing note reviewed.   73 year old male, resting comfortably and in no acute distress. Vital signs are normal. Oxygen saturation is 94%, which is normal. Head is normocephalic and atraumatic. PERRLA, EOMI. Oropharynx is mildly erythematous. Neck is nontender and supple without adenopathy. Lungs are clear without rales, wheezes, or rhonchi. Chest is nontender. Heart has regular rate and rhythm without murmur. Abdomen is soft, flat, nontender. Extremities have no cyanosis  or edema, full range of motion is present. Skin is warm and dry without rash. Neurologic: Mental status is normal, moves all extremities equally.  ED Results / Procedures / Treatments   Labs (all labs ordered are listed, but only abnormal results are displayed) Labs Reviewed  RESP PANEL BY RT-PCR (RSV, FLU A&B, COVID)  RVPGX2 - Abnormal; Notable for the following components:      Result Value   SARS Coronavirus 2 by RT PCR POSITIVE (*)    All other components within normal limits  URINALYSIS, ROUTINE W REFLEX MICROSCOPIC - Abnormal; Notable for the following components:    Hgb urine dipstick SMALL (*)    Protein, ur 30 (*)    Bacteria, UA RARE (*)    All other components within normal limits  BASIC METABOLIC PANEL WITH GFR - Abnormal; Notable for the following components:   Sodium 133 (*)    Chloride 97 (*)    Glucose, Bld 114 (*)    Creatinine, Ser 1.25 (*)    Calcium 8.8 (*)    All other components within normal limits  GROUP A STREP BY PCR  CBC   Procedures Procedures    Medications Ordered in ED Medications  acetaminophen (TYLENOL) tablet 650 mg (650 mg Oral Given 01/13/24 0022)    ED Course/ Medical Decision Making/ A&P                                 Medical Decision Making Amount and/or Complexity of Data Reviewed Labs: ordered.  Risk OTC drugs. Prescription drug management.   Fever with sore throat and cough.  Consider streptococcal infection, COVID-19, influenza, RSV, other viral infections.  I have reviewed his laboratory test, my interpretation is negative PCR for Streptococcus, influenza, RSV.  Positive PCR for COVID-19.  COVID-19 is what is causing his symptoms.  Urinalysis positive for protein and 6-10 RBCs per high-power field, mild hyponatremia, elevated random glucose level, mild renal insufficiency, normal CBC.  Patient does have significant risk factors of age, hypertension, atrial flutter.  I discussed with him advantages and disadvantages of antiviral therapy and, through shared decision making, decision was made to prescribe Paxlovid.  He does not have any indications for hospitalization.  I am discharging him with instructions to return should he develop confusion or difficulty breathing.  Final Clinical Impression(s) / ED Diagnoses Final diagnoses:  COVID-19 virus infection  Chronic anticoagulation    Rx / DC Orders ED Discharge Orders          Ordered    nirmatrelvir/ritonavir (PAXLOVID, 300/100,) 20 x 150 MG & 10 x 100MG  TBPK  2 times daily        01/13/24 0443              Alissa April, MD 01/13/24  [phone]

## 2024-01-26 DIAGNOSIS — I4892 Unspecified atrial flutter: Secondary | ICD-10-CM | POA: Diagnosis not present

## 2024-01-26 DIAGNOSIS — N400 Enlarged prostate without lower urinary tract symptoms: Secondary | ICD-10-CM | POA: Diagnosis not present

## 2024-01-26 DIAGNOSIS — I10 Essential (primary) hypertension: Secondary | ICD-10-CM | POA: Diagnosis not present

## 2024-01-26 DIAGNOSIS — E7800 Pure hypercholesterolemia, unspecified: Secondary | ICD-10-CM | POA: Diagnosis not present

## 2024-01-26 DIAGNOSIS — R361 Hematospermia: Secondary | ICD-10-CM | POA: Diagnosis not present

## 2024-01-26 DIAGNOSIS — N1831 Chronic kidney disease, stage 3a: Secondary | ICD-10-CM | POA: Diagnosis not present

## 2024-01-26 DIAGNOSIS — Z0000 Encounter for general adult medical examination without abnormal findings: Secondary | ICD-10-CM | POA: Diagnosis not present

## 2024-01-26 DIAGNOSIS — R7303 Prediabetes: Secondary | ICD-10-CM | POA: Diagnosis not present

## 2024-01-26 DIAGNOSIS — Z1331 Encounter for screening for depression: Secondary | ICD-10-CM | POA: Diagnosis not present

## 2024-01-26 DIAGNOSIS — Z125 Encounter for screening for malignant neoplasm of prostate: Secondary | ICD-10-CM | POA: Diagnosis not present

## 2024-03-15 DIAGNOSIS — R972 Elevated prostate specific antigen [PSA]: Secondary | ICD-10-CM | POA: Diagnosis not present

## 2024-03-15 DIAGNOSIS — R361 Hematospermia: Secondary | ICD-10-CM | POA: Diagnosis not present

## 2024-05-04 DIAGNOSIS — L570 Actinic keratosis: Secondary | ICD-10-CM | POA: Diagnosis not present

## 2024-05-04 DIAGNOSIS — L814 Other melanin hyperpigmentation: Secondary | ICD-10-CM | POA: Diagnosis not present

## 2024-05-04 DIAGNOSIS — D225 Melanocytic nevi of trunk: Secondary | ICD-10-CM | POA: Diagnosis not present

## 2024-05-04 DIAGNOSIS — L821 Other seborrheic keratosis: Secondary | ICD-10-CM | POA: Diagnosis not present

## 2024-05-04 DIAGNOSIS — Z85828 Personal history of other malignant neoplasm of skin: Secondary | ICD-10-CM | POA: Diagnosis not present

## 2024-05-05 DIAGNOSIS — G4710 Hypersomnia, unspecified: Secondary | ICD-10-CM | POA: Diagnosis not present

## 2024-05-05 DIAGNOSIS — N400 Enlarged prostate without lower urinary tract symptoms: Secondary | ICD-10-CM | POA: Diagnosis not present

## 2024-05-26 DIAGNOSIS — I4892 Unspecified atrial flutter: Secondary | ICD-10-CM | POA: Diagnosis not present

## 2024-05-26 DIAGNOSIS — R7303 Prediabetes: Secondary | ICD-10-CM | POA: Diagnosis not present

## 2024-05-26 DIAGNOSIS — R0683 Snoring: Secondary | ICD-10-CM | POA: Diagnosis not present

## 2024-05-26 DIAGNOSIS — I129 Hypertensive chronic kidney disease with stage 1 through stage 4 chronic kidney disease, or unspecified chronic kidney disease: Secondary | ICD-10-CM | POA: Diagnosis not present

## 2024-05-26 DIAGNOSIS — I10 Essential (primary) hypertension: Secondary | ICD-10-CM | POA: Diagnosis not present

## 2024-05-26 DIAGNOSIS — G4719 Other hypersomnia: Secondary | ICD-10-CM | POA: Diagnosis not present

## 2024-06-02 ENCOUNTER — Ambulatory Visit: Payer: Self-pay | Admitting: Urgent Care

## 2024-06-02 ENCOUNTER — Ambulatory Visit
Admission: EM | Admit: 2024-06-02 | Discharge: 2024-06-02 | Disposition: A | Discharge status: Home / Self Care | Attending: Family Medicine | Admitting: Family Medicine

## 2024-06-02 ENCOUNTER — Ambulatory Visit (INDEPENDENT_AMBULATORY_CARE_PROVIDER_SITE_OTHER)

## 2024-06-02 DIAGNOSIS — T07XXXA Unspecified multiple injuries, initial encounter: Secondary | ICD-10-CM

## 2024-06-02 DIAGNOSIS — R0781 Pleurodynia: Secondary | ICD-10-CM

## 2024-06-02 DIAGNOSIS — R0789 Other chest pain: Secondary | ICD-10-CM

## 2024-06-02 HISTORY — DX: Personal history of other diseases of the circulatory system: Z86.79

## 2024-06-02 HISTORY — DX: Essential (primary) hypertension: I10

## 2024-06-02 HISTORY — DX: Benign prostatic hyperplasia without lower urinary tract symptoms: N40.0

## 2024-06-02 HISTORY — DX: Pure hypercholesterolemia, unspecified: E78.00

## 2024-06-02 MED ORDER — CYCLOBENZAPRINE HCL 5 MG PO TABS: 5.0000 mg | ORAL_TABLET | Freq: Every evening | ORAL | 0 refills | Status: AC | PRN

## 2024-06-02 MED ORDER — PREDNISONE 20 MG PO TABS: 20.0000 mg | ORAL_TABLET | Freq: Every day | ORAL | 0 refills | Status: AC
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
## 2024-06-15 DIAGNOSIS — I10 Essential (primary) hypertension: Secondary | ICD-10-CM | POA: Diagnosis not present

## 2024-06-15 DIAGNOSIS — I483 Typical atrial flutter: Secondary | ICD-10-CM | POA: Diagnosis not present

## 2024-06-15 DIAGNOSIS — Z7901 Long term (current) use of anticoagulants: Secondary | ICD-10-CM | POA: Diagnosis not present

## 2024-06-15 DIAGNOSIS — E785 Hyperlipidemia, unspecified: Secondary | ICD-10-CM | POA: Diagnosis not present

## 2024-06-17 DIAGNOSIS — G4733 Obstructive sleep apnea (adult) (pediatric): Secondary | ICD-10-CM | POA: Diagnosis not present

## 2024-06-24 DIAGNOSIS — Z23 Encounter for immunization: Secondary | ICD-10-CM | POA: Diagnosis not present

## 2024-06-29 DIAGNOSIS — G4733 Obstructive sleep apnea (adult) (pediatric): Secondary | ICD-10-CM | POA: Diagnosis not present

## 2024-06-29 DIAGNOSIS — N400 Enlarged prostate without lower urinary tract symptoms: Secondary | ICD-10-CM | POA: Diagnosis not present

## 2024-06-29 DIAGNOSIS — I4892 Unspecified atrial flutter: Secondary | ICD-10-CM | POA: Diagnosis not present

## 2024-06-29 DIAGNOSIS — I10 Essential (primary) hypertension: Secondary | ICD-10-CM | POA: Diagnosis not present

## 2024-06-29 DIAGNOSIS — R7303 Prediabetes: Secondary | ICD-10-CM | POA: Diagnosis not present

## 2024-06-29 DIAGNOSIS — I129 Hypertensive chronic kidney disease with stage 1 through stage 4 chronic kidney disease, or unspecified chronic kidney disease: Secondary | ICD-10-CM | POA: Diagnosis not present

## 2024-07-27 DIAGNOSIS — N400 Enlarged prostate without lower urinary tract symptoms: Secondary | ICD-10-CM | POA: Diagnosis not present

## 2024-07-27 DIAGNOSIS — R7303 Prediabetes: Secondary | ICD-10-CM | POA: Diagnosis not present

## 2024-07-27 DIAGNOSIS — N1831 Chronic kidney disease, stage 3a: Secondary | ICD-10-CM | POA: Diagnosis not present

## 2024-07-27 DIAGNOSIS — E7800 Pure hypercholesterolemia, unspecified: Secondary | ICD-10-CM | POA: Diagnosis not present

## 2024-07-27 DIAGNOSIS — I4892 Unspecified atrial flutter: Secondary | ICD-10-CM | POA: Diagnosis not present

## 2024-07-27 DIAGNOSIS — R361 Hematospermia: Secondary | ICD-10-CM | POA: Diagnosis not present

## 2024-07-27 DIAGNOSIS — I10 Essential (primary) hypertension: Secondary | ICD-10-CM | POA: Diagnosis not present

## 2024-08-02 DIAGNOSIS — R400 Somnolence: Secondary | ICD-10-CM | POA: Diagnosis not present

## 2024-08-22 DIAGNOSIS — G4733 Obstructive sleep apnea (adult) (pediatric): Secondary | ICD-10-CM | POA: Diagnosis not present
